# Patient Record
Sex: Female | Born: 1960 | Race: Black or African American | Hispanic: No | Smoking: Current every day smoker
Health system: Southern US, Community
[De-identification: ages and names within clinical notes are randomized; demographics above are authoritative.]

---

## 2020-12-06 ENCOUNTER — Other Ambulatory Visit: Payer: Self-pay

## 2020-12-06 ENCOUNTER — Emergency Department (HOSPITAL_COMMUNITY)
Admission: EM | Admit: 2020-12-06 | Discharge: 2020-12-06 | Disposition: A | Discharge status: Home / Self Care | Payer: Medicaid Other | Attending: Emergency Medicine | Admitting: Emergency Medicine

## 2020-12-06 DIAGNOSIS — R110 Nausea: Secondary | ICD-10-CM | POA: Insufficient documentation

## 2020-12-06 DIAGNOSIS — Z5321 Procedure and treatment not carried out due to patient leaving prior to being seen by health care provider: Secondary | ICD-10-CM | POA: Diagnosis not present

## 2020-12-06 DIAGNOSIS — R109 Unspecified abdominal pain: Secondary | ICD-10-CM | POA: Diagnosis present

## 2020-12-06 LAB — COMPREHENSIVE METABOLIC PANEL
ALT: 18 U/L (ref 0–44)
AST: 15 U/L (ref 15–41)
Albumin: 2.8 g/dL — ABNORMAL LOW (ref 3.5–5.0)
Alkaline Phosphatase: 87 U/L (ref 38–126)
Anion gap: 13 (ref 5–15)
BUN: 12 mg/dL (ref 8–23)
CO2: 27 mmol/L (ref 22–32)
Calcium: 9.2 mg/dL (ref 8.9–10.3)
Chloride: 103 mmol/L (ref 98–111)
Creatinine, Ser: 0.96 mg/dL (ref 0.44–1.00)
GFR, Estimated: >60 mL/min (ref >60)
Glucose, Bld: 83 mg/dL (ref 70–99)
Potassium: 3.5 mmol/L (ref 3.5–5.1)
Sodium: 143 mmol/L (ref 135–145)
Total Bilirubin: 0.2 mg/dL — ABNORMAL LOW (ref 0.3–1.2)
Total Protein: 6.3 g/dL — ABNORMAL LOW (ref 6.5–8.1)

## 2020-12-06 LAB — CBC
HCT: 29.9 % — ABNORMAL LOW (ref 36.0–46.0)
Hemoglobin: 9.9 g/dL — ABNORMAL LOW (ref 12.0–15.0)
MCH: 28.9 pg (ref 26.0–34.0)
MCHC: 33.1 g/dL (ref 30.0–36.0)
MCV: 87.2 fL (ref 80.0–100.0)
Platelets: 324 10*3/uL (ref 150–400)
RBC: 3.43 MIL/uL — ABNORMAL LOW (ref 3.87–5.11)
RDW: 17.8 % — ABNORMAL HIGH (ref 11.5–15.5)
WBC: 8.4 10*3/uL (ref 4.0–10.5)
nRBC: 0 % (ref 0.0–0.2)

## 2020-12-06 LAB — LIPASE, BLOOD: Lipase: 24 U/L (ref 11–51)

## 2020-12-06 NOTE — ED Notes (Signed)
Called for patient for 3rd time no Answer.

## 2020-12-06 NOTE — ED Notes (Signed)
Pt requested her IV be removed > IV was started by EMS. IV removed 06:02

## 2020-12-06 NOTE — ED Notes (Signed)
Called for Patient 2 X's no answer.

## 2020-12-06 NOTE — ED Triage Notes (Signed)
Pt presents to ED BIB [facility]. Pt c/o central abd pain. Per pt she was in hosp last month and had NG, pt believes that nurses there were trying to kill her and gave her wrong medication through NG. Pt believes this is where her pain is coming from. Reports nausea, denies v/d

## 2020-12-29 ENCOUNTER — Emergency Department (HOSPITAL_BASED_OUTPATIENT_CLINIC_OR_DEPARTMENT_OTHER): Payer: Medicaid Other

## 2020-12-29 ENCOUNTER — Other Ambulatory Visit: Payer: Self-pay

## 2020-12-29 ENCOUNTER — Emergency Department (HOSPITAL_BASED_OUTPATIENT_CLINIC_OR_DEPARTMENT_OTHER)
Admission: EM | Admit: 2020-12-29 | Discharge: 2020-12-30 | Disposition: A | Discharge status: Home / Self Care | Payer: Medicaid Other | Attending: Emergency Medicine | Admitting: Emergency Medicine

## 2020-12-29 ENCOUNTER — Encounter (HOSPITAL_BASED_OUTPATIENT_CLINIC_OR_DEPARTMENT_OTHER): Payer: Self-pay | Admitting: *Deleted

## 2020-12-29 DIAGNOSIS — Z7982 Long term (current) use of aspirin: Secondary | ICD-10-CM | POA: Insufficient documentation

## 2020-12-29 DIAGNOSIS — M25512 Pain in left shoulder: Secondary | ICD-10-CM | POA: Diagnosis not present

## 2020-12-29 DIAGNOSIS — R519 Headache, unspecified: Secondary | ICD-10-CM | POA: Diagnosis not present

## 2020-12-29 DIAGNOSIS — F17200 Nicotine dependence, unspecified, uncomplicated: Secondary | ICD-10-CM | POA: Insufficient documentation

## 2020-12-29 DIAGNOSIS — Z7901 Long term (current) use of anticoagulants: Secondary | ICD-10-CM | POA: Insufficient documentation

## 2020-12-29 DIAGNOSIS — R0602 Shortness of breath: Secondary | ICD-10-CM | POA: Diagnosis not present

## 2020-12-29 DIAGNOSIS — R079 Chest pain, unspecified: Secondary | ICD-10-CM | POA: Diagnosis present

## 2020-12-29 LAB — CBC WITH DIFFERENTIAL/PLATELET
Abs Immature Granulocytes: 0.02 10*3/uL (ref 0.00–0.07)
Basophils Absolute: 0 10*3/uL (ref 0.0–0.1)
Basophils Relative: 0 %
Eosinophils Absolute: 0.1 10*3/uL (ref 0.0–0.5)
Eosinophils Relative: 1 %
HCT: 30.2 % — ABNORMAL LOW (ref 36.0–46.0)
Hemoglobin: 9.6 g/dL — ABNORMAL LOW (ref 12.0–15.0)
Immature Granulocytes: 0 %
Lymphocytes Relative: 29 %
Lymphs Abs: 2 10*3/uL (ref 0.7–4.0)
MCH: 26.8 pg (ref 26.0–34.0)
MCHC: 31.8 g/dL (ref 30.0–36.0)
MCV: 84.4 fL (ref 80.0–100.0)
Monocytes Absolute: 0.5 10*3/uL (ref 0.1–1.0)
Monocytes Relative: 8 %
Neutro Abs: 4.5 10*3/uL (ref 1.7–7.7)
Neutrophils Relative %: 62 %
Platelets: 265 10*3/uL (ref 150–400)
RBC: 3.58 MIL/uL — ABNORMAL LOW (ref 3.87–5.11)
RDW: 18.5 % — ABNORMAL HIGH (ref 11.5–15.5)
WBC: 7.1 10*3/uL (ref 4.0–10.5)
nRBC: 0 % (ref 0.0–0.2)

## 2020-12-29 NOTE — ED Notes (Signed)
ED Provider at bedside. 

## 2020-12-29 NOTE — ED Triage Notes (Signed)
Chest pain and sharp pain in her head x 45 minutes. She has been "sick" for 2 days.

## 2020-12-30 ENCOUNTER — Emergency Department (HOSPITAL_BASED_OUTPATIENT_CLINIC_OR_DEPARTMENT_OTHER): Payer: Medicaid Other

## 2020-12-30 ENCOUNTER — Encounter (HOSPITAL_BASED_OUTPATIENT_CLINIC_OR_DEPARTMENT_OTHER): Payer: Self-pay | Admitting: Emergency Medicine

## 2020-12-30 LAB — BASIC METABOLIC PANEL
Anion gap: 10 (ref 5–15)
BUN: 22 mg/dL (ref 8–23)
CO2: 26 mmol/L (ref 22–32)
Calcium: 9 mg/dL (ref 8.9–10.3)
Chloride: 105 mmol/L (ref 98–111)
Creatinine, Ser: 0.93 mg/dL (ref 0.44–1.00)
GFR, Estimated: >60 mL/min (ref >60)
Glucose, Bld: 102 mg/dL — ABNORMAL HIGH (ref 70–99)
Potassium: 3.3 mmol/L — ABNORMAL LOW (ref 3.5–5.1)
Sodium: 141 mmol/L (ref 135–145)

## 2020-12-30 LAB — TROPONIN I (HIGH SENSITIVITY): Troponin I (High Sensitivity): 9 ng/L (ref <18)

## 2020-12-30 MED ORDER — IOHEXOL 350 MG/ML SOLN
100.0000 mL | Freq: Once | INTRAVENOUS | Status: AC | PRN
  Administered 2020-12-30: 100 mL via INTRAVENOUS

## 2020-12-30 MED ORDER — ACETAMINOPHEN 500 MG PO TABS
1000.0000 mg | ORAL_TABLET | Freq: Once | ORAL | Status: AC
  Administered 2020-12-30: 1000 mg via ORAL
  Filled 2020-12-30: qty 2

## 2020-12-30 NOTE — ED Notes (Signed)
Pt refused to have second troponin drawn. Pt also states that she is now ready to go and would not like to stay for results or re-eval. Provider made aware.

## 2020-12-30 NOTE — ED Provider Notes (Signed)
[facility] EMERGENCY DEPARTMENT Provider Note   CSN: [id number] Arrival date & time: 12/29/20  2256     History Chief Complaint  Patient presents with  . Chest Pain    Cristina Chavez is a 61 y.o. female.  The history is provided by the patient.  Chest Pain Pain location:  L chest Pain quality: sharp   Pain radiates to:  L shoulder Pain severity:  Moderate Onset quality:  Gradual Duration:  2 days Timing:  Constant Progression:  Unchanged Chronicity:  New Context: at rest   Relieved by:  Nothing Worsened by:  Nothing Ineffective treatments:  None tried Associated symptoms: no abdominal pain, no dizziness, no fever, no palpitations and no shortness of breath   Associated symptoms comment:  Also facial pain  Risk factors: not female   Patient is here for 2 days of left chest pain and left shoulder pain that is sharp, also left cheek pain.  No sob.  No DOE.  No n/v/d.       History reviewed. No pertinent past medical history.  There are no problems to display for this patient.   History reviewed. No pertinent surgical history.   OB History   No obstetric history on file.     History reviewed. No pertinent family history.  Social History   Tobacco Use  . Smoking status: Current Every Day Smoker  . Smokeless tobacco: Never Used  Substance Use Topics  . Alcohol use: Not Currently  . Drug use: Never    Home Medications Prior to Admission medications   Medication Sig Start Date End Date Taking? Authorizing Provider  albuterol (VENTOLIN HFA) 108 (90 Base) MCG/ACT inhaler Inhale into the lungs. 06/12/19  Yes [provider]  apixaban (ELIQUIS) 5 MG TABS tablet Take by mouth. 12/09/20  Yes [provider]  aspirin 81 MG EC tablet Take by mouth.   Yes [provider]  atorvastatin (LIPITOR) 80 MG tablet Take by mouth. 07/14/20  Yes [provider]  busPIRone (BUSPAR) 10 MG tablet Take 1 tablet by mouth 2 (two) times  daily. 07/14/20  Yes [provider]  carvedilol (COREG) 12.5 MG tablet Take 1 tablet by mouth 2 (two) times daily. 12/16/20  Yes [provider]  ergocalciferol (VITAMIN D2) 1.25 MG (50000 UT) capsule Take by mouth. 11/05/20  Yes [provider]  famotidine (PEPCID) 20 MG tablet Take by mouth. 07/14/20  Yes [provider]  fluticasone furoate-vilanterol (BREO ELLIPTA) 100-25 MCG/INH AEPB TAKE 1 PUFF BY MOUTH EVERY DAY 05/01/20  Yes [provider]  furosemide (LASIX) 20 MG tablet Take by mouth. 07/14/20  Yes [provider]  ipratropium-albuterol (DUONEB) 0.5-2.5 (3) MG/3ML SOLN Inhale into the lungs. 12/21/20  Yes [provider]  losartan (COZAAR) 50 MG tablet Take by mouth. 12/09/20  Yes [provider]  magnesium oxide (MAG-OX) 400 MG tablet Take by mouth. 09/12/19  Yes [provider]  nicotine (NICODERM CQ - DOSED IN MG/24 HOURS) 21 mg/24hr patch APPLY 1 PATCH ONTO THE SKIN EVERY DAY 12/24/20  Yes [provider]  nitroGLYCERIN (NITROSTAT) 0.4 MG SL tablet PLACE 1 TABLET UNDER TONGUE AS NEEDED FOR CHEST PAIN-OFFICE VISIT NEEDED FOR REFILLS 12/09/20  Yes [provider]  potassium chloride (KLOR-CON) 10 MEQ tablet Take by mouth. 12/10/20  Yes [provider]  QUEtiapine (SEROQUEL) 100 MG tablet Take 1 tablet by mouth at bedtime. 10/16/20  Yes [provider]  triamcinolone ointment (KENALOG) 0.1 % Apply small  amount to affected area and lightly rub in daily 07/14/20  Yes [provider]    Allergies    Patient has no known allergies.  Review of Systems   Review of Systems  Constitutional: Negative for fever.  HENT: Negative for dental problem.   Eyes: Negative for visual disturbance.  Respiratory: Negative for shortness of breath.   Cardiovascular: Positive for chest pain. Negative for palpitations and leg swelling.  Gastrointestinal: Negative for abdominal pain.   Genitourinary: Negative for difficulty urinating.  Musculoskeletal: Positive for arthralgias. Negative for joint swelling.  Skin: Negative for color change.  Neurological: Negative for dizziness.  Psychiatric/Behavioral: Negative for sleep disturbance.  All other systems reviewed and are negative.   Physical Exam Updated Vital Signs BP 114/85   Pulse (!) 125   Temp 98 F (36.7 C) (Oral)   Resp 15   SpO2 96%   Physical Exam Vitals and nursing note reviewed.  Constitutional:      General: She is not in acute distress.    Appearance: Normal appearance. She is not diaphoretic.  HENT:     Head: Normocephalic and atraumatic.     Nose: Nose normal.  Eyes:     Conjunctiva/sclera: Conjunctivae normal.     Pupils: Pupils are equal, round, and reactive to light.  Cardiovascular:     Rate and Rhythm: Normal rate and regular rhythm.     Pulses: Normal pulses.     Heart sounds: Normal heart sounds.  Pulmonary:     Effort: Pulmonary effort is normal.     Breath sounds: Normal breath sounds.  Abdominal:     General: Abdomen is flat. Bowel sounds are normal.     Palpations: Abdomen is soft.     Tenderness: There is no abdominal tenderness. There is no guarding.  Musculoskeletal:        General: Normal range of motion.     Cervical back: Normal range of motion and neck supple.     Right lower leg: No edema.     Left lower leg: No edema.  Skin:    General: Skin is warm and dry.     Capillary Refill: Capillary refill takes less than 2 seconds.  Neurological:     General: No focal deficit present.     Mental Status: She is alert and oriented to person, place, and time.     Deep Tendon Reflexes: Reflexes normal.  Psychiatric:        Mood and Affect: Affect is angry.        Behavior: Behavior is aggressive.     ED Results / Procedures / Treatments   Labs (all labs ordered are listed, but only abnormal results are displayed) Results for orders placed or performed during the  hospital encounter of 12/29/20  CBC with Differential/Platelet  Result Value Ref Range   WBC 7.1 4.0 - 10.5 K/uL   RBC 3.58 (L) 3.87 - 5.11 MIL/uL   Hemoglobin 9.6 (L) 12.0 - 15.0 g/dL   HCT 06.2 (L) 37.6 - 28.3 %   MCV 84.4 80.0 - 100.0 fL   MCH 26.8 26.0 - 34.0 pg   MCHC 31.8 30.0 - 36.0 g/dL   RDW 15.1 (H) 76.1 - 60.7 %   Platelets 265 150 - 400 K/uL   nRBC 0.0 0.0 - 0.2 %   Neutrophils Relative % 62 %   Neutro Abs 4.5 1.7 - 7.7 K/uL   Lymphocytes Relative 29 %   Lymphs Abs 2.0 0.7 - 4.0  K/uL   Monocytes Relative 8 %   Monocytes Absolute 0.5 0.1 - 1.0 K/uL   Eosinophils Relative 1 %   Eosinophils Absolute 0.1 0.0 - 0.5 K/uL   Basophils Relative 0 %   Basophils Absolute 0.0 0.0 - 0.1 K/uL   Immature Granulocytes 0 %   Abs Immature Granulocytes 0.02 0.00 - 0.07 K/uL  Basic metabolic panel  Result Value Ref Range   Sodium 141 135 - 145 mmol/L   Potassium 3.3 (L) 3.5 - 5.1 mmol/L   Chloride 105 98 - 111 mmol/L   CO2 26 22 - 32 mmol/L   Glucose, Bld 102 (H) 70 - 99 mg/dL   BUN 22 8 - 23 mg/dL   Creatinine, Ser 9.14 0.44 - 1.00 mg/dL   Calcium 9.0 8.9 - 78.2 mg/dL   GFR, Estimated >95 >62 mL/min   Anion gap 10 5 - 15  Troponin I (High Sensitivity)  Result Value Ref Range   Troponin I (High Sensitivity) 9 <18 ng/L   CT Head Wo Contrast  Result Date: 12/30/2020 CLINICAL DATA:  Headache EXAM: CT HEAD WITHOUT CONTRAST TECHNIQUE: Contiguous axial images were obtained from the base of the skull through the vertex without intravenous contrast. COMPARISON:  None. FINDINGS: Brain: There is no mass, hemorrhage or extra-axial collection. The size and configuration of the ventricles and extra-axial CSF spaces are normal. There is hypoattenuation of the white matter, most commonly indicating chronic small vessel disease. Vascular: No abnormal hyperdensity of the major intracranial arteries or dural venous sinuses. No intracranial atherosclerosis. Skull: The visualized skull base, calvarium  and extracranial soft tissues are normal. Sinuses/Orbits: No fluid levels or advanced mucosal thickening of the visualized paranasal sinuses. No mastoid or middle ear effusion. The orbits are normal. IMPRESSION: Mild chronic small vessel disease without acute intracranial abnormality. Electronically Signed   By: Deatra Robinson M.D.   On: 12/30/2020 00:29   DG Chest Portable 1 View  Result Date: 12/29/2020 CLINICAL DATA:  Chest pain EXAM: PORTABLE CHEST 1 VIEW COMPARISON:  11/30/2020 FINDINGS: Prior median sternotomy. Mild cardiomegaly. Linear right base atelectasis. No confluent opacity on the left. No effusions or acute bony abnormality. IMPRESSION: Cardiomegaly. Right base atelectasis. Electronically Signed   By: Charlett Nose M.D.   On: 12/29/2020 23:29    EKG EKG Interpretation  Date/Time:  Tuesday December 29 2020 23:03:08 EST Ventricular Rate:  125 PR Interval:    QRS Duration: 80 QT Interval:  370 QTC Calculation: 534 R Axis:   -18 Text Interpretation: Sinus node reentrant tachycardia PVCs Confirmed by Nicanor Alcon, Kylil Swopes ([id number]) on 12/30/2020 12:00:13 AM   Radiology CT Head Wo Contrast  Result Date: 12/30/2020 CLINICAL DATA:  Headache EXAM: CT HEAD WITHOUT CONTRAST TECHNIQUE: Contiguous axial images were obtained from the base of the skull through the vertex without intravenous contrast. COMPARISON:  None. FINDINGS: Brain: There is no mass, hemorrhage or extra-axial collection. The size and configuration of the ventricles and extra-axial CSF spaces are normal. There is hypoattenuation of the white matter, most commonly indicating chronic small vessel disease. Vascular: No abnormal hyperdensity of the major intracranial arteries or dural venous sinuses. No intracranial atherosclerosis. Skull: The visualized skull base, calvarium and extracranial soft tissues are normal. Sinuses/Orbits: No fluid levels or advanced mucosal thickening of the visualized paranasal sinuses. No mastoid or middle ear  effusion. The orbits are normal. IMPRESSION: Mild chronic small vessel disease without acute intracranial abnormality. Electronically Signed   By: Deatra Robinson M.D.   On: 12/30/2020 00:29  DG Chest Portable 1 View  Result Date: 12/29/2020 CLINICAL DATA:  Chest pain EXAM: PORTABLE CHEST 1 VIEW COMPARISON:  11/30/2020 FINDINGS: Prior median sternotomy. Mild cardiomegaly. Linear right base atelectasis. No confluent opacity on the left. No effusions or acute bony abnormality. IMPRESSION: Cardiomegaly. Right base atelectasis. Electronically Signed   By: Charlett NoseKevin  Dover M.D.   On: 12/29/2020 23:29    Procedures Procedures (including critical care time)  Medications Ordered in ED Medications  acetaminophen (TYLENOL) tablet 1,000 mg (1,000 mg Oral Given 12/30/20 0011)  iohexol (OMNIPAQUE) 350 MG/ML injection 100 mL (100 mLs Intravenous Contrast Given 12/30/20 0116)    ED Course  I have reviewed the triage vital signs and the nursing notes.  Pertinent labs & imaging results that were available during my care of the patient were reviewed by me and considered in my medical decision making (see chart for details).    Patient is refusing CT.  She is angry and does not want this test.  She has yelled at me and at staff.  I have informed patient we are concerned about dissection and this could be fatal.  Patient verbalizes understanding and does not want care at this time.   Final Clinical Impression(s) / ED Diagnoses Patient is AO4 and has decision making capacity to refuse care.  I have explained to the patient that the risks of signing out against medical advice are but are not limited to: death, coma, dissection,  Myocardial damage, and prolonged morbidity and pain.  Patient understands these risks and wants to leave against medical advice, she is welcome to return at any time.     Saloni Lablanc, MD 12/30/20 [phone]

## 2020-12-30 NOTE — ED Notes (Signed)
Patient transported to CT 

## 2020-12-30 NOTE — ED Notes (Signed)
Went into pt room to disconnect from monitor and help pt out AMA after obtaining signature. Pt states she wants her CT results called to her. Pt then agrees to stay for results and states "You have until 3 am and I'm leaving. Fuck your form."

## 2021-09-18 DEATH — deceased

## 2022-07-19 IMAGING — CT CT ANGIO CHEST
3 of 9 series · 19 of 46 positions shown · IV contrast (Omnipaque)
Comparison: 03/05/2020

CLINICAL DATA: Chest pain

EXAM:
CT ANGIOGRAPHY CHEST WITH CONTRAST
TECHNIQUE: Multidetector CT imaging of the chest was performed using the
standard protocol during bolus administration of intravenous
contrast. Multiplanar CT image reconstructions and MIPs were
obtained to evaluate the vascular anatomy.
CONTRAST:  100mL OMNIPAQUE IOHEXOL 350 MG/ML SOLN

[Series 5: axial arterial · axial · arterial · 0.65mm/px · z∈[-360,-120]mm · 12 of 96 slices shown]
[im 8/96  lung]
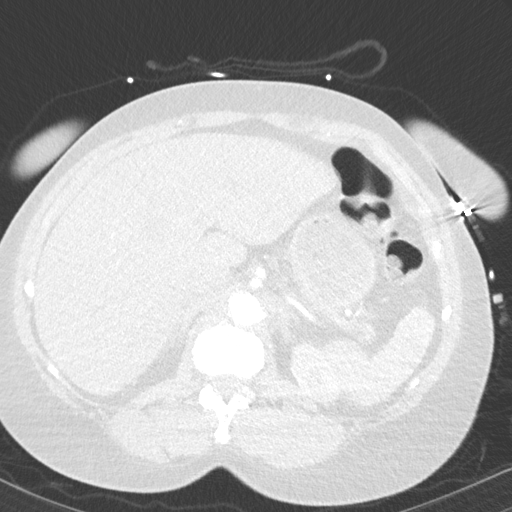
[im 15/96  soft-tissue]
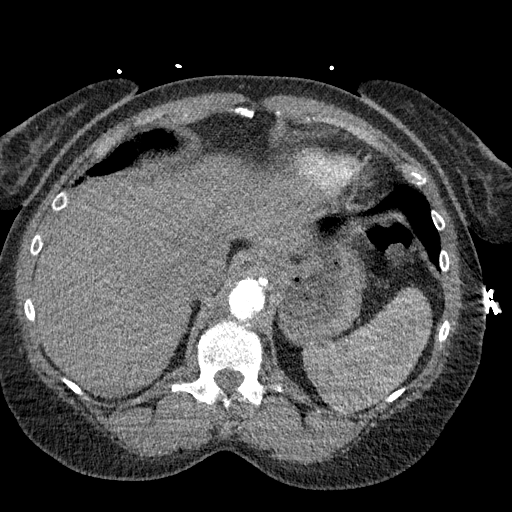
[im 22/96  lung]
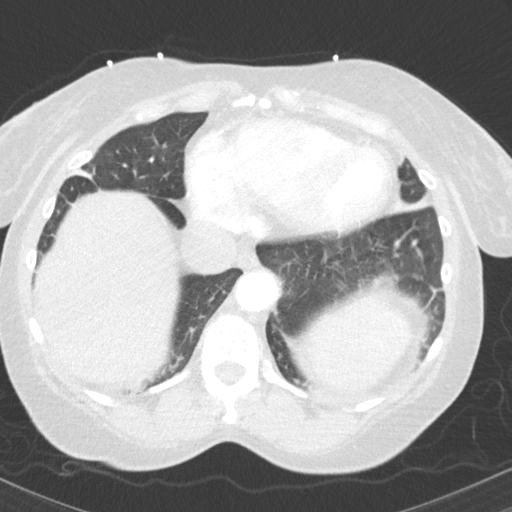
[im 30/96  soft-tissue]
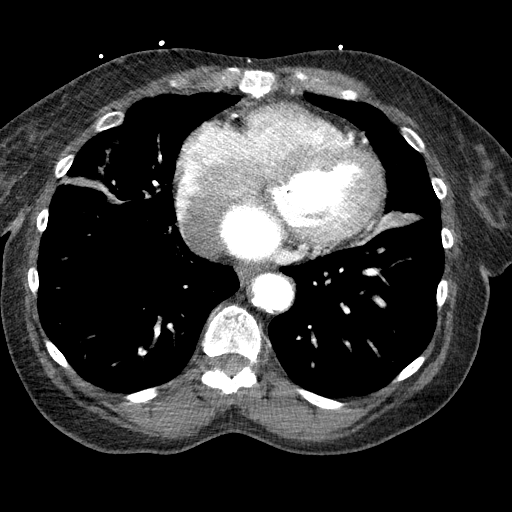
[im 37/96  lung]
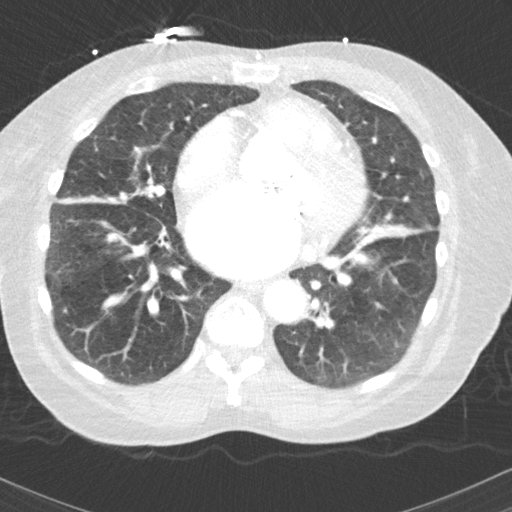
[im 44/96  soft-tissue]
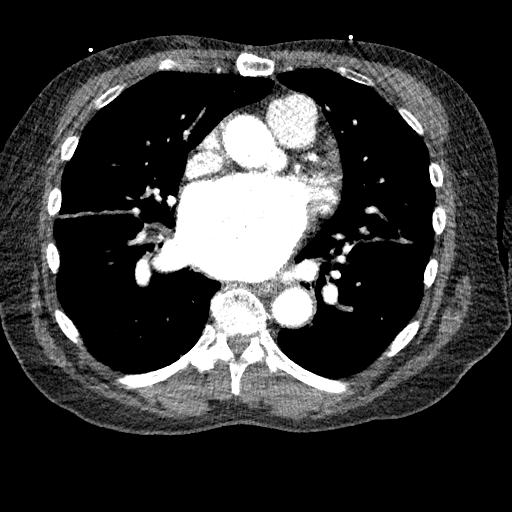
[im 52/96  lung]
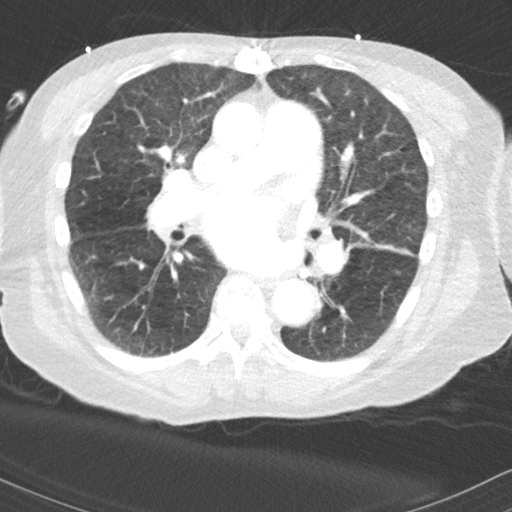
[im 59/96  soft-tissue]
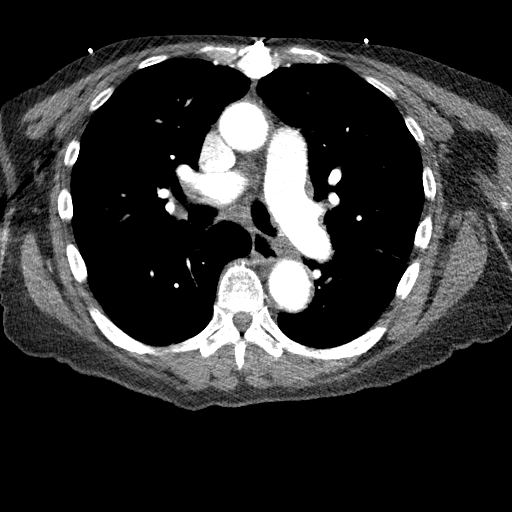
[im 66/96  lung]
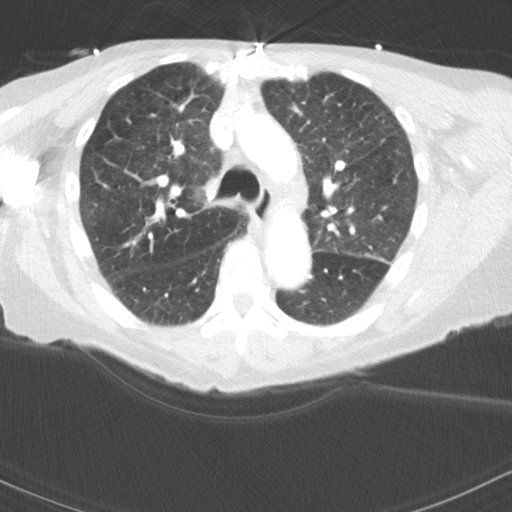
[im 74/96  soft-tissue]
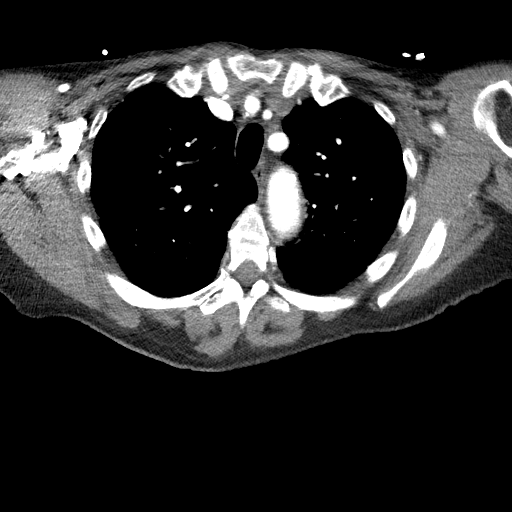
[im 81/96  lung]
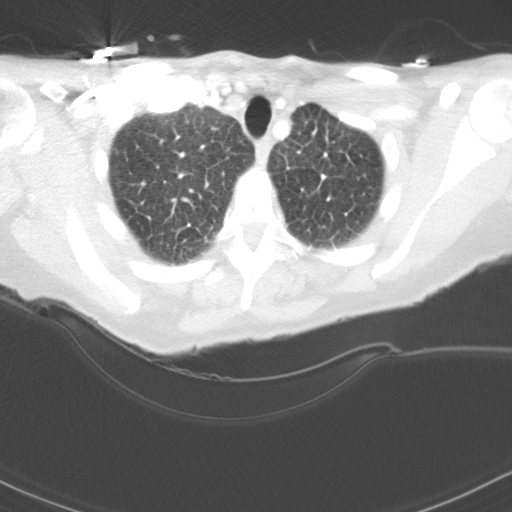
[im 88/96  soft-tissue]
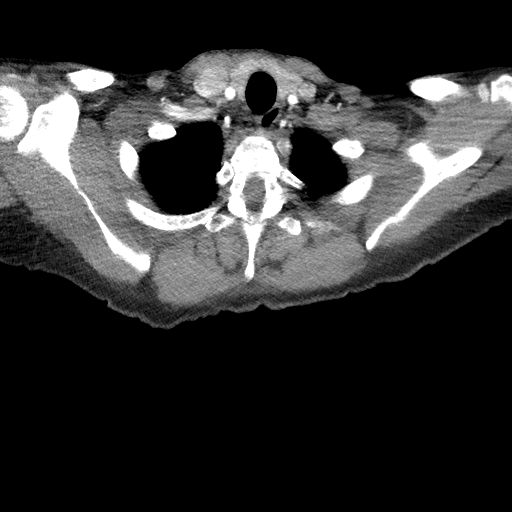

[Series 6: lung · axial · 0.65mm/px · z∈[-341,-221]mm · 4 of 58 slices shown]
[im 9/58  soft-tissue]
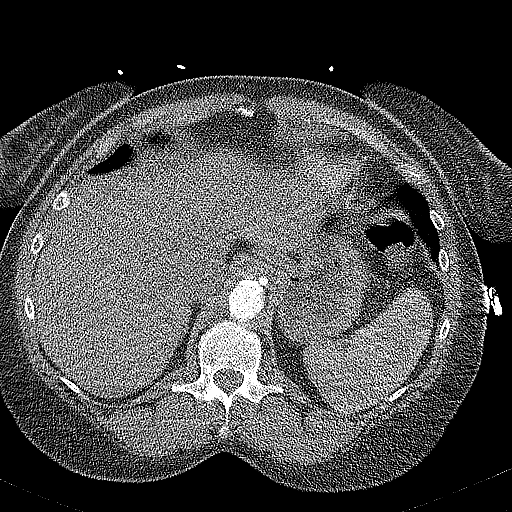
[im 17/58  soft-tissue]
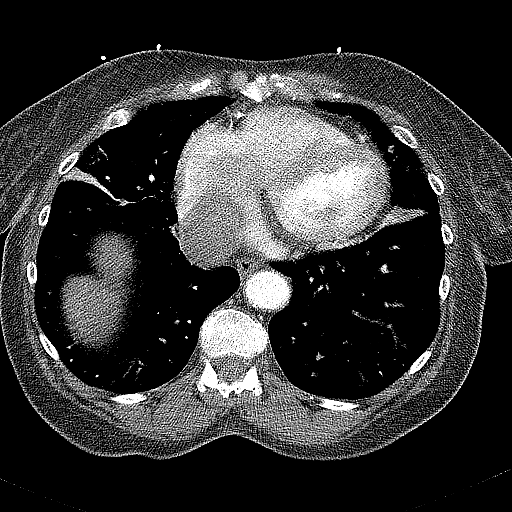
[im 25/58  soft-tissue]
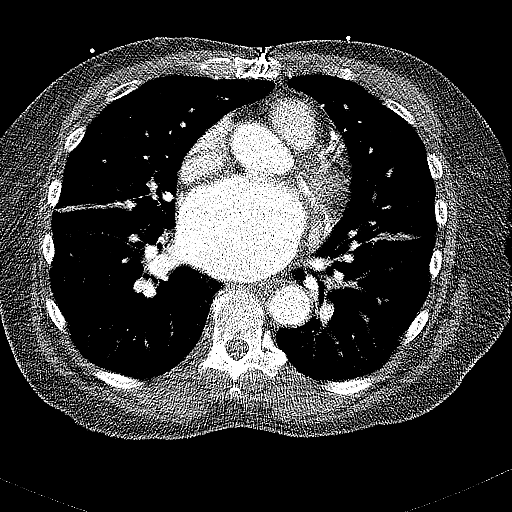
[im 33/58  soft-tissue]
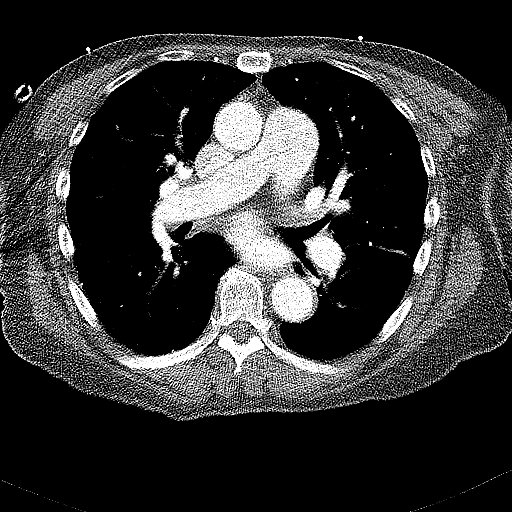

[Series 7: coronals · coronal · 0.61mm/px · 3 of 145 slices shown]
[im 37/145  soft-tissue]
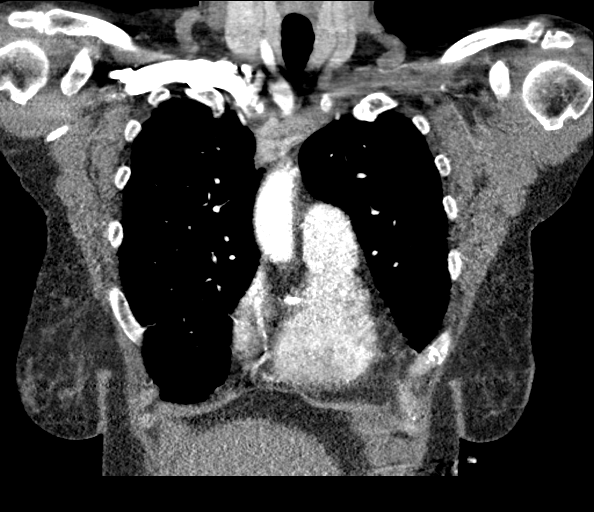
[im 73/145  soft-tissue]
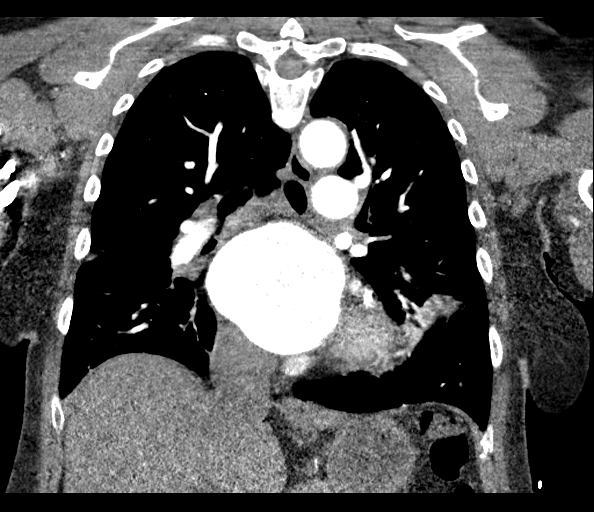
[im 109/145  soft-tissue]
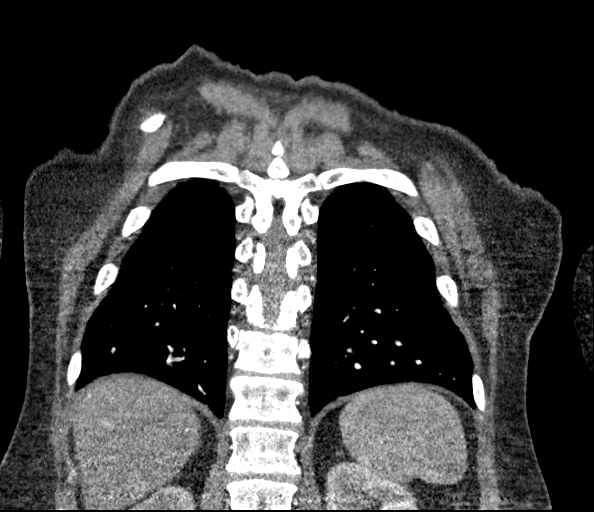

[19 of 46 positions shown; findings below may reference images not displayed]

FINDINGS: Cardiovascular: No filling defects in the pulmonary arteries to
suggest pulmonary emboli. Cardiomegaly. No evidence of aortic
aneurysm or dissection. Coronary artery and aortic atherosclerosis.

Mediastinum/Nodes: No mediastinal, hilar, or axillary adenopathy.
Trachea and esophagus are unremarkable. Thyroid unremarkable.

Lungs/Pleura: Emphysema. Scarring in the right middle lobe and
lingula at the lung bases. No confluent opacities or effusions.

Upper Abdomen: Imaging into the upper abdomen demonstrates no acute
findings.

Musculoskeletal: Chest wall soft tissues are unremarkable. No acute
bony abnormality.

Review of the MIP images confirms the above findings.
IMPRESSION: No evidence of pulmonary embolus, aortic aneurysm or dissection.

Scarring at the lung bases.

Coronary artery disease.

Aortic Atherosclerosis ([id number]-[id number].O) and Emphysema ([id number].X).
